# Patient Record
Sex: Male | Born: 1995 | Race: White | Hispanic: No | Marital: Single | Smoking: Never smoker
Health system: Southern US, Community
[De-identification: ages and names within clinical notes are randomized; demographics above are authoritative.]

## PROBLEM LIST (undated history)

## (undated) DIAGNOSIS — M109 Gout, unspecified: Secondary | ICD-10-CM

---
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
## 2013-08-24 ENCOUNTER — Emergency Department: Payer: Self-pay | Admitting: Emergency Medicine

## 2013-10-20 IMAGING — CT CT HEAD WITHOUT CONTRAST
2 series · 16 of 30 positions shown, 20 images · non-contrast
Comparison: none

REASON FOR EXAM: ams
COMMENTS:   May transport without cardiac monitor

PROCEDURE:     CT  - CT HEAD WITHOUT CONTRAST  - January 11, 2012  [DATE]
RESULT:     Comparison:  None
TECHNIQUE: Multiple axial images from the foramen magnum to the vertex were
obtained without IV contrast.

[Series 2: without · axial · non-contrast · 0.42mm/px · z∈[+680,+800]mm · 13 of 30 slices shown, 17 images]
[im 3/30  brain]
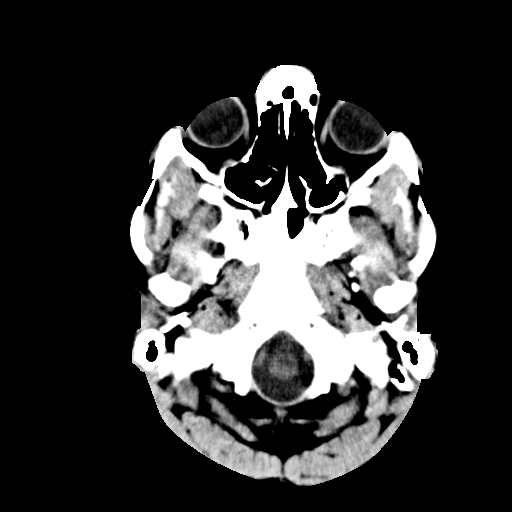
[im 3/30  bone]
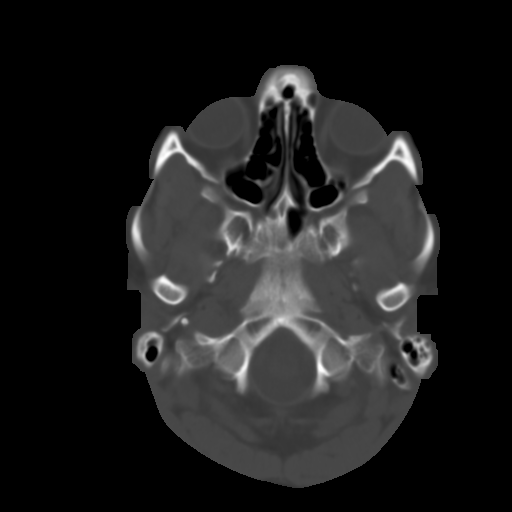
[im 5/30  brain]
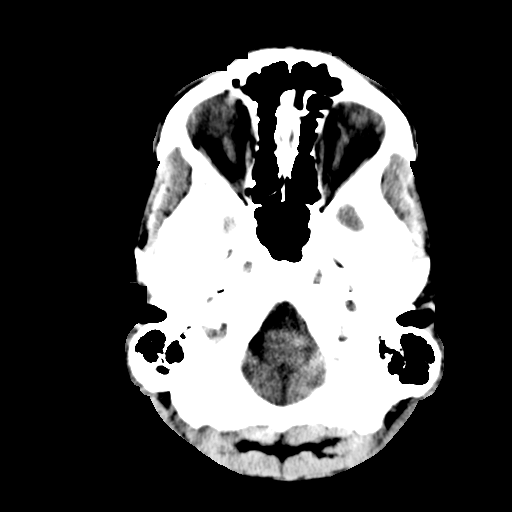
[im 7/30  brain]
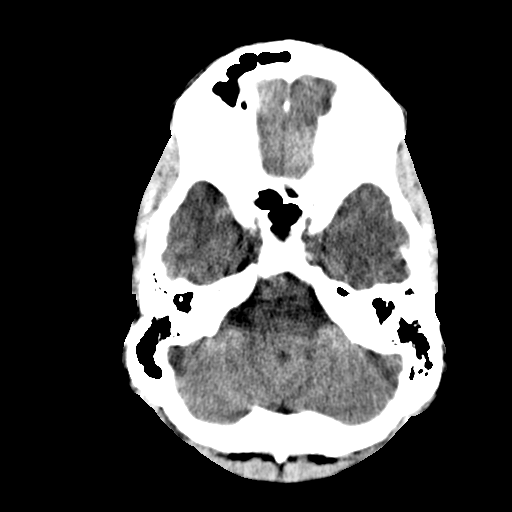
[im 9/30  brain]
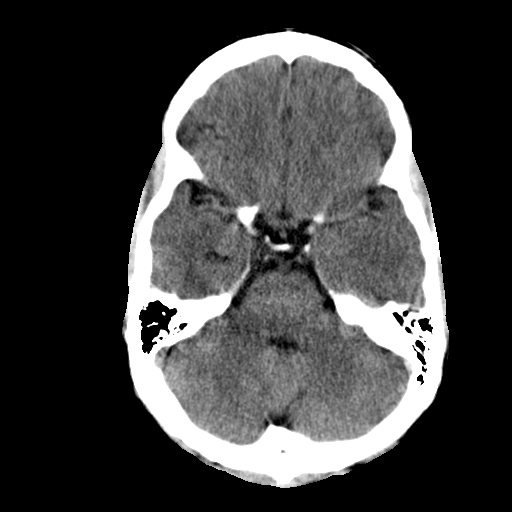
[im 11/30  brain]
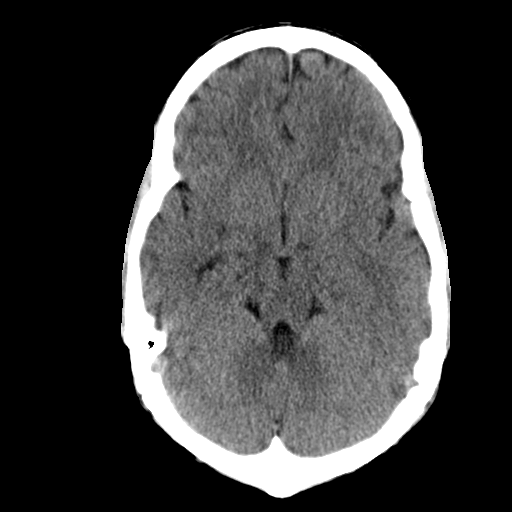
[im 11/30  bone]
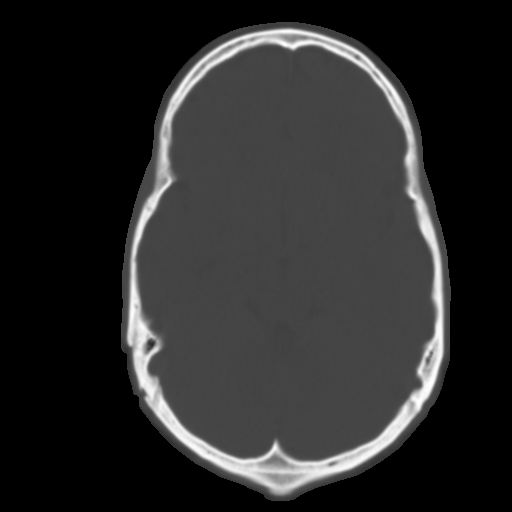
[im 13/30  brain]
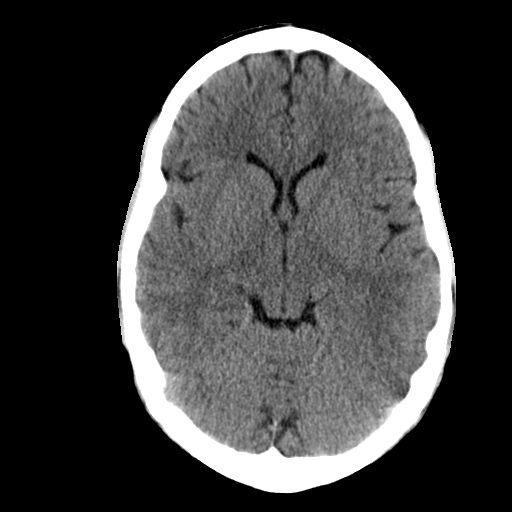
[im 15/30  brain]
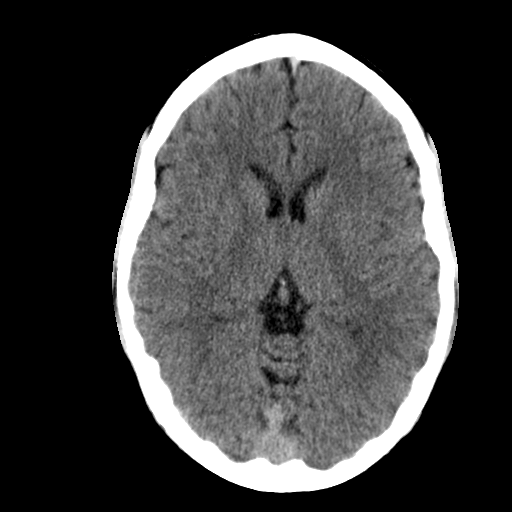
[im 17/30  brain]
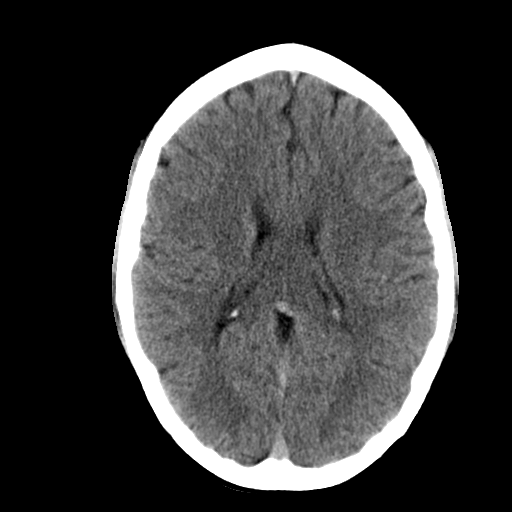
[im 19/30  brain]
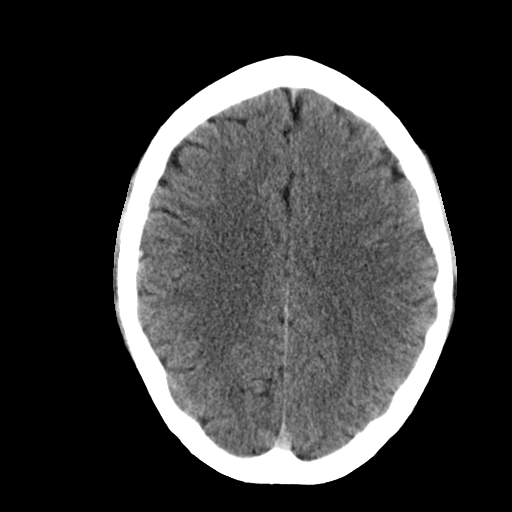
[im 19/30  bone]
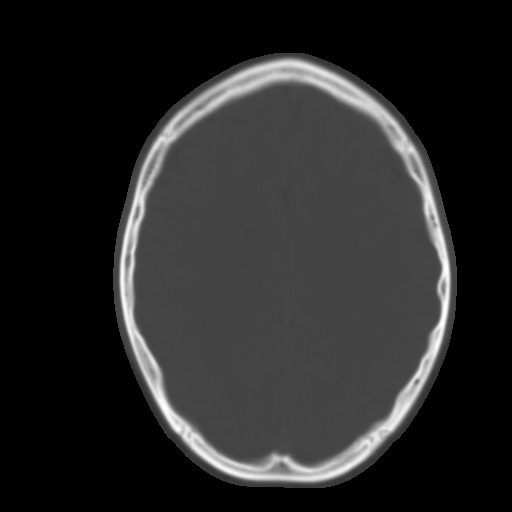
[im 21/30  brain]
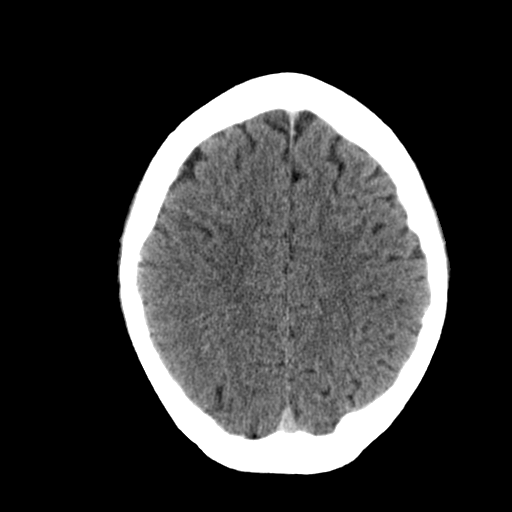
[im 23/30  brain]
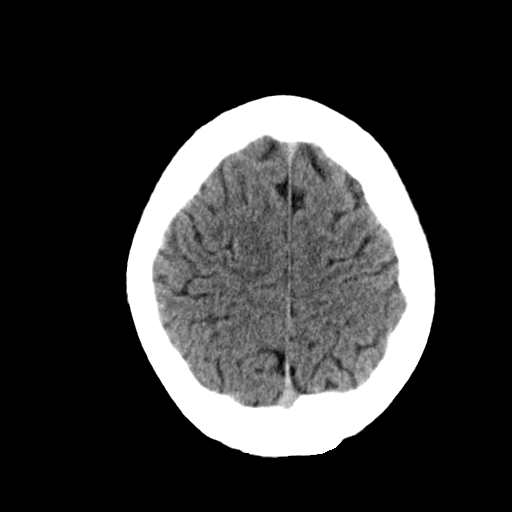
[im 25/30  brain]
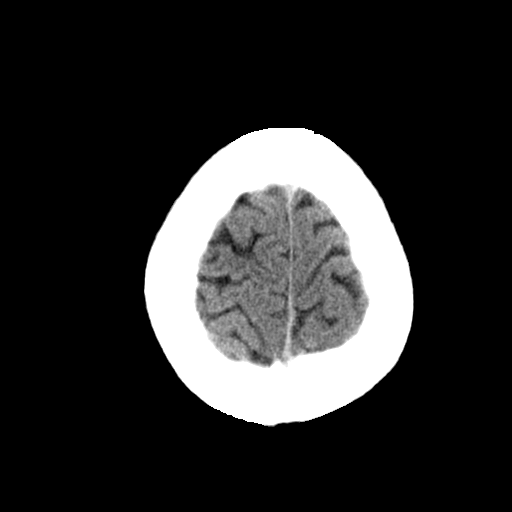
[im 27/30  brain]
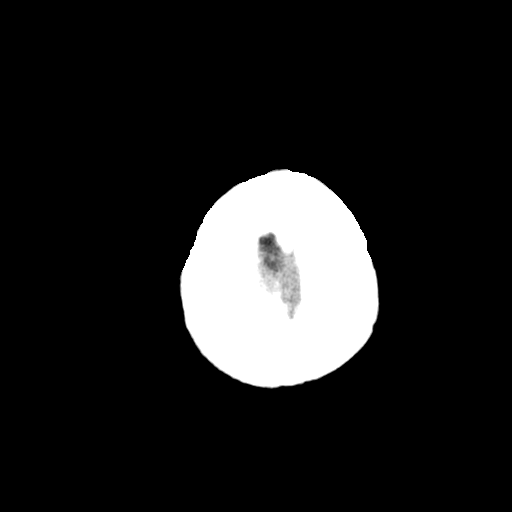
[im 27/30  bone]
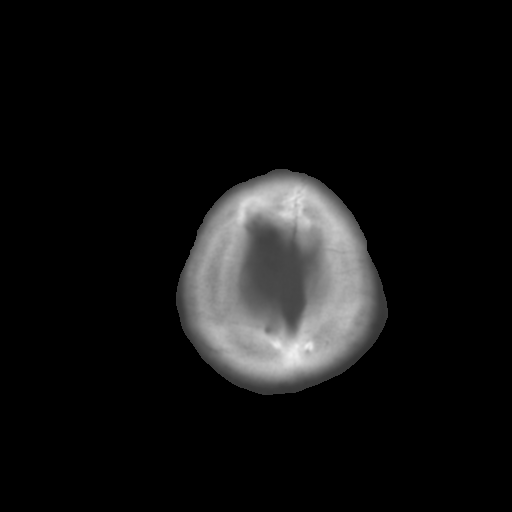

[Series 3: bone · axial · 0.42mm/px · z∈[+680,+720]mm · 3 of 30 slices shown]
[im 3/30  bone]
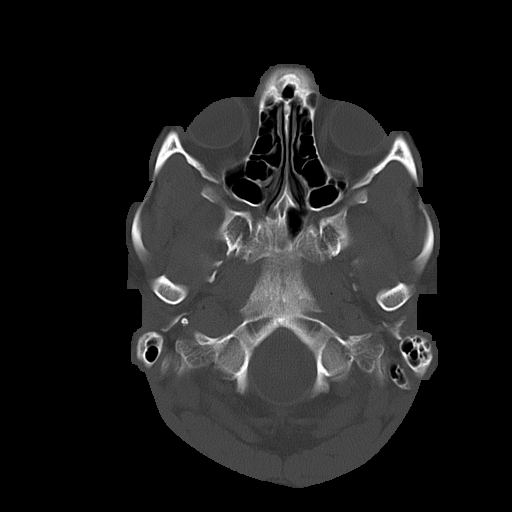
[im 7/30  bone]
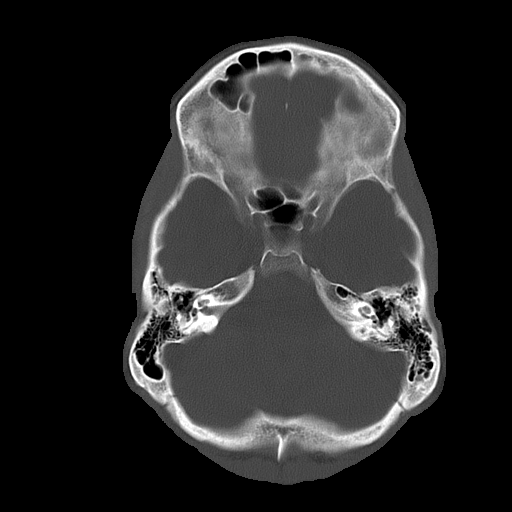
[im 11/30  bone]
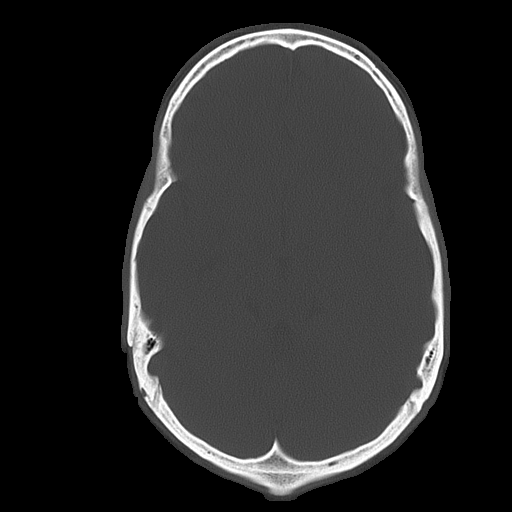

[16 of 30 positions shown; findings below may reference images not displayed]

FINDINGS: There is no evidence for mass effect, midline shift, or extra-axial fluid
collections. There is no evidence for space-occupying lesion, intracranial
hemorrhage, or cortical-based area of infarction.

The osseous structures are unremarkable.
IMPRESSION: No acute intracranial process.

## 2015-09-25 ENCOUNTER — Emergency Department
Admission: EM | Admit: 2015-09-25 | Discharge: 2015-09-25 | Disposition: A | Discharge status: Home / Self Care | Payer: Worker's Compensation | Attending: Student | Admitting: Student

## 2015-09-25 ENCOUNTER — Emergency Department: Payer: Worker's Compensation

## 2015-09-25 ENCOUNTER — Encounter: Payer: Self-pay | Admitting: Emergency Medicine

## 2015-09-25 DIAGNOSIS — W228XXA Striking against or struck by other objects, initial encounter: Secondary | ICD-10-CM | POA: Diagnosis not present

## 2015-09-25 DIAGNOSIS — Y990 Civilian activity done for income or pay: Secondary | ICD-10-CM | POA: Insufficient documentation

## 2015-09-25 DIAGNOSIS — Y9389 Activity, other specified: Secondary | ICD-10-CM | POA: Diagnosis not present

## 2015-09-25 DIAGNOSIS — Y9289 Other specified places as the place of occurrence of the external cause: Secondary | ICD-10-CM | POA: Insufficient documentation

## 2015-09-25 DIAGNOSIS — S01111A Laceration without foreign body of right eyelid and periocular area, initial encounter: Secondary | ICD-10-CM | POA: Insufficient documentation

## 2015-09-25 DIAGNOSIS — S0181XA Laceration without foreign body of other part of head, initial encounter: Secondary | ICD-10-CM

## 2015-09-25 DIAGNOSIS — S0511XA Contusion of eyeball and orbital tissues, right eye, initial encounter: Secondary | ICD-10-CM

## 2015-09-25 MED ORDER — TRAMADOL HCL 50 MG PO TABS: 50.0000 mg | ORAL_TABLET | Freq: Four times a day (QID) | ORAL | Status: AC | PRN

## 2015-09-25 MED ORDER — OXYCODONE-ACETAMINOPHEN 5-325 MG PO TABS
1.0000 | ORAL_TABLET | Freq: Once | ORAL | Status: AC
  Administered 2015-09-25: 1 via ORAL
  Filled 2015-09-25: qty 1

## 2015-09-25 MED ORDER — LIDOCAINE HCL (PF) 1 % IJ SOLN
INTRAMUSCULAR | Status: AC
  Administered 2015-09-25: 12:00:00
  Filled 2015-09-25: qty 5

## 2015-09-25 NOTE — ED Notes (Signed)
Just pta.  Was at work and a metal thing struck in his right eye--valve stem tool?

## 2015-09-25 NOTE — Discharge Instructions (Signed)
Advise to not apply any creams Altman 2 Dermabond area.

## 2015-09-25 NOTE — ED Provider Notes (Cosign Needed)
[facility] Emergency Department Provider Note  ____________________________________________  Time seen: Approximately 12:04 PM  I have reviewed the triage vital signs and the nursing notes.   HISTORY  Chief Complaint Laceration    HPI Kenneth Dunlap is a 20 y.o. male patient struck in the right eye with a stem valve tool secondary to changing a tire. Patient has a laceration of the upper eyelid. Incident occurred prior to arrival at work. He is rating his pain as 8/10. Patient state he has decreased ability to open the right eye secondary to pain. No paralysis measures taken prior to arrival.   History reviewed. No pertinent past medical history.  There are no active problems to display for this patient.   No past surgical history on file.  No current outpatient prescriptions on file.  Allergies Review of patient's allergies indicates no known allergies.  No family history on file.  Social History Social History  Substance Use Topics  . Smoking status: Never Smoker   . Smokeless tobacco: Current User  . Alcohol Use: Yes    Review of Systems Constitutional: No fever/chills Eyes: No visual changes. ENT: No sore throat. Cardiovascular: Denies chest pain. Respiratory: Denies shortness of breath. Gastrointestinal: No abdominal pain.  No nausea, no vomiting.  No diarrhea.  No constipation. Genitourinary: Negative for dysuria. Musculoskeletal: Negative for back pain. Skin: Negative for rash. Right upper eyelid laceration. Neurological: Negative for headaches, focal weakness or numbness. 10-point ROS otherwise negative.  ____________________________________________   PHYSICAL EXAM:  VITAL SIGNS: ED Triage Vitals  Enc Vitals Group     BP 09/25/15 1140 138/97 mmHg     Pulse Rate 09/25/15 1140 72     Resp 09/25/15 1140 16     Temp 09/25/15 1140 98.2 F (36.8 C)     Temp Source 09/25/15 1140 Oral     SpO2 09/25/15 1140 98 %     Weight  09/25/15 1140 190 lb (86.183 kg)     Height 09/25/15 1140  (1.753 m)     Head Cir --      Peak Flow --      Pain Score 09/25/15 1141 8     Pain Loc --      Pain Edu? --      Excl. in GC? --     Constitutional: Alert and oriented. Well appearing and in no acute distress. Eyes: Conjunctivae are normal. PERRL. EOMI. Head: Atraumatic. Nose: No congestion/rhinnorhea. Mouth/Throat: Mucous membranes are moist.  Oropharynx non-erythematous. Neck: No stridor.  No cervical spine tenderness to palpation. Hematological/Lymphatic/Immunilogical: No cervical lymphadenopathy. Cardiovascular: Normal rate, regular rhythm. Grossly normal heart sounds.  Good peripheral circulation. Respiratory: Normal respiratory effort.  No retractions. Lungs CTAB. Gastrointestinal: Soft and nontender. No distention. No abdominal bruits. No CVA tenderness. Musculoskeletal: No lower extremity tenderness nor edema.  No joint effusions. Neurologic:  Normal speech and language. No gross focal neurologic deficits are appreciated. No gait instability. Skin:  Skin is warm, dry and intact. No rash noted. There are 0.5 cm laceration upper eyelid. Psychiatric: Mood and affect are normal. Speech and behavior are normal.  ____________________________________________   LABS (all labs ordered are listed, but only abnormal results are displayed)  Labs Reviewed - No data to display ____________________________________________  EKG   ____________________________________________  RADIOLOGY  X-ray was negative for any facial fractures ____________________________________________   PROCEDURES  Procedure(s) performed: See procedure note  Critical [facility]____LACERATION REPAIR Performed by: Joni Reining Authorized by:  Joni Reining Consent: Verbal consent obtained. Risks and benefits: risks, benefits and alternatives were discussed Consent given by: patient Patient identity confirmed:  provided demographic data Prepped and Draped in normal sterile fashion Wound explored  Laceration Location: Right upper eyelid  Laceration Length: 0.5 cm  No Foreign Bodies seen or palpated  Anesthesia: local infiltration  Local anesthetic: lidocaine 1% without epinephrine  Anesthetic total: 1 mL  Irrigation method: syringe Amount of cleaning: standard  Skin closure: Dermabond  Patient tolerance: Patient tolerated the procedure well with no immediate complications. _______________________________   INITIAL IMPRESSION / ASSESSMENT AND PLAN / ED COURSE  Pertinent labs & imaging results that were available during my care of the patient were reviewed by me and considered in my medical decision making (see chart for details).  Facial laceration. Patient given discharge care instructions. Patient given prescription for tramadol. He is given a work note for today and advised return by ER for condition worsens. ____________________________________________   FINAL CLINICAL IMPRESSION(S) / ED DIAGNOSES  Final diagnoses:  Facial laceration, initial encounter  Periorbital contusion, right, initial encounter      Joni Reining, PA-C 09/25/15 1325

## 2016-06-11 ENCOUNTER — Encounter: Payer: Self-pay | Admitting: *Deleted

## 2016-06-11 ENCOUNTER — Emergency Department
Admission: EM | Admit: 2016-06-11 | Discharge: 2016-06-11 | Disposition: A | Discharge status: Home / Self Care | Payer: Self-pay | Attending: Student in an Organized Health Care Education/Training Program | Admitting: Student in an Organized Health Care Education/Training Program

## 2016-06-11 DIAGNOSIS — J039 Acute tonsillitis, unspecified: Secondary | ICD-10-CM | POA: Insufficient documentation

## 2016-06-11 DIAGNOSIS — Z79899 Other long term (current) drug therapy: Secondary | ICD-10-CM | POA: Insufficient documentation

## 2016-06-11 DIAGNOSIS — F17290 Nicotine dependence, other tobacco product, uncomplicated: Secondary | ICD-10-CM | POA: Insufficient documentation

## 2016-06-11 LAB — POCT RAPID STREP A: STREPTOCOCCUS, GROUP A SCREEN (DIRECT): NEGATIVE

## 2016-06-11 MED ORDER — AMOXICILLIN 500 MG PO CAPS: 500.0000 mg | ORAL_CAPSULE | Freq: Three times a day (TID) | ORAL | 0 refills | Status: AC

## 2016-06-11 MED ORDER — LIDOCAINE VISCOUS 2 % MT SOLN: 5.0000 mL | Freq: Four times a day (QID) | OROMUCOSAL | 0 refills | Status: AC | PRN

## 2016-06-11 MED ORDER — DIPHENHYDRAMINE HCL 12.5 MG/5ML PO SYRP: 12.5000 mg | ORAL_SOLUTION | Freq: Four times a day (QID) | ORAL | 0 refills | Status: AC | PRN

## 2016-06-11 NOTE — ED Triage Notes (Signed)
Pt complains of sore throat and fever for two days

## 2016-06-11 NOTE — ED Notes (Signed)
Sore throat, pain with swallowing X 2 days. Pt reports fever at home. Pt alert and oriented X4, active, cooperative, pt in NAD. RR even and unlabored, color WNL.

## 2016-06-11 NOTE — ED Provider Notes (Signed)
[facility] Emergency Department Provider Note   ____________________________________________   None    (approximate)  I have reviewed the triage vital signs and the nursing notes.   HISTORY  Chief Complaint Sore Throat    HPI Kenneth Dunlap is a 20 y.o. male patient complaining of sore throat for 2 days. Patient state increased difficulty tolerating food and fluids. Patient denies any dyspnea. Patient states taking Tylenol for the pain. Patient rates his pain discomfort as a 7/10. Patient described a pain as "sore".   History reviewed. No pertinent past medical history.  There are no active problems to display for this patient.   History reviewed. No pertinent surgical history.  Prior to Admission medications   Medication Sig Start Date End Date Taking? Authorizing Provider  traMADol (ULTRAM) 50 MG tablet Take 1 tablet (50 mg total) by mouth every 6 (six) hours as needed for moderate pain. 09/25/15   Joni Reining, PA-C    Allergies Review of patient's allergies indicates no known allergies.  No family history on file.  Social History Social History  Substance Use Topics  . Smoking status: Never Smoker  . Smokeless tobacco: Current User  . Alcohol use Yes    Review of Systems Constitutional: No fever/chills Eyes: No visual changes. ENT: Sore throat Cardiovascular: Denies chest pain. Respiratory: Denies shortness of breath. Gastrointestinal: No abdominal pain.  No nausea, no vomiting.  No diarrhea.  No constipation. Genitourinary: Negative for dysuria. Musculoskeletal: Negative for back pain. Skin: Negative for rash. Neurological: Negative for headaches, focal weakness or numbness.    ____________________________________________   PHYSICAL EXAM:  VITAL SIGNS: ED Triage Vitals  Enc Vitals Group     BP 06/11/16 0952 128/90     Pulse --      Resp 06/11/16 0952 20     Temp 06/11/16 0952 99.1 F (37.3 C)     Temp Source  06/11/16 0952 Oral     SpO2 06/11/16 0952 99 %     Weight 06/11/16 0947 190 lb (86.2 kg)     Height 06/11/16 0947 5\' 8"  (1.727 m)     Head Circumference --      Peak Flow --      Pain Score 06/11/16 0948 8     Pain Loc --      Pain Edu? --      Excl. in GC? --     Constitutional: Alert and oriented. Well appearing and in no acute distress. Eyes: Conjunctivae are normal. PERRL. EOMI. Head: Atraumatic. Nose: No congestion/rhinnorhea. Mouth/Throat: Mucous membranes are moist.  Oropharynx erythematous.Edematous with no exudative tonsils Neck: No stridor.  No cervical spine tenderness to palpation. Hematological/Lymphatic/Immunilogical: Bilateral cervical lymphadenopathy. Cardiovascular: Normal rate, regular rhythm. Grossly normal heart sounds.  Good peripheral circulation. Respiratory: Normal respiratory effort.  No retractions. Lungs CTAB. Gastrointestinal: Soft and nontender. No distention. No abdominal bruits. No CVA tenderness. Musculoskeletal: No lower extremity tenderness nor edema.  No joint effusions. Neurologic:  Normal speech and language. No gross focal neurologic deficits are appreciated. No gait instability. Skin:  Skin is warm, dry and intact. No rash noted. Psychiatric: Mood and affect are normal. Speech and behavior are normal.  ____________________________________________   LABS (all labs ordered are listed, but only abnormal results are displayed)  Labs Reviewed - No data to display ____________________________________________  EKG   ____________________________________________  RADIOLOGY   ____________________________________________   PROCEDURES  Procedure(s) performed: None  Procedures  Critical Care performed: No  ____________________________________________  INITIAL IMPRESSION / ASSESSMENT AND PLAN / ED COURSE  Pertinent labs & imaging results that were available during my care of the patient were reviewed by me and considered in my  medical decision making (see chart for details).  Tonsillitis. Patient given discharge care instructions. Patient given a prescription for amoxicillin, viscous lidocaine, and Benadryl elixir. Patient given a work note for 2 days.  Clinical Course     ____________________________________________   FINAL CLINICAL IMPRESSION(S) / ED DIAGNOSES  Final diagnoses:  Tonsillitis      NEW MEDICATIONS STARTED DURING THIS VISIT:  New Prescriptions   No medications on file     Note:  This document was prepared using Dragon voice recognition software and may include unintentional dictation errors.    Joni Reiningonald K Trayquan Kolakowski, PA-C 06/11/16 1111    Willy EddyPatrick Robinson, MD 06/11/16 [phone]

## 2016-06-11 NOTE — ED Notes (Signed)
Pt alert and oriented X4, active, cooperative, pt in NAD. RR even and unlabored, color WNL.  Pt informed to return if any life threatening symptoms occur.   

## 2016-06-13 LAB — CULTURE, GROUP A STREP (THRC)

## 2017-03-01 ENCOUNTER — Emergency Department: Payer: Self-pay

## 2017-03-01 ENCOUNTER — Emergency Department
Admission: EM | Admit: 2017-03-01 | Discharge: 2017-03-01 | Disposition: A | Discharge status: Home / Self Care | Payer: Self-pay | Attending: Emergency Medicine | Admitting: Emergency Medicine

## 2017-03-01 DIAGNOSIS — M109 Gout, unspecified: Secondary | ICD-10-CM | POA: Insufficient documentation

## 2017-03-01 DIAGNOSIS — F17290 Nicotine dependence, other tobacco product, uncomplicated: Secondary | ICD-10-CM | POA: Insufficient documentation

## 2017-03-01 MED ORDER — MELOXICAM 15 MG PO TABS: 15.0000 mg | ORAL_TABLET | Freq: Every day | ORAL | 0 refills | Status: AC

## 2017-03-01 MED ORDER — MELOXICAM 7.5 MG PO TABS
15.0000 mg | ORAL_TABLET | Freq: Once | ORAL | Status: AC
  Administered 2017-03-01: 15 mg via ORAL
  Filled 2017-03-01: qty 2

## 2017-03-01 MED ORDER — HYDROCODONE-ACETAMINOPHEN 5-325 MG PO TABS
1.0000 | ORAL_TABLET | Freq: Once | ORAL | Status: AC
  Administered 2017-03-01: 1 via ORAL
  Filled 2017-03-01: qty 1

## 2017-03-01 NOTE — ED Notes (Signed)
Pt states walks on concrete floor for work approx 10 hours daily. Pt states he began to have right foot pain with ambulation to bottom of great toe "a while ago". Pt states pain has progressively gotten worse to base of right foot. Cms intact to right toes. 3+ right pedal pulse noted.

## 2017-03-01 NOTE — ED Provider Notes (Signed)
[facility] Emergency Department Provider Note ____________________________________________  Time seen: Approximately 8:51 PM  I have reviewed the triage vital signs and the nursing notes.   HISTORY  Chief Complaint Foot Injury    HPI Kenneth Dunlap is a 21 y.o. male who presents to the emergency department for evaluation ofright foot pain. Pain is located on the plantar aspect of the right foot. He reports pain and swelling to the ball of foot for the past 2-3 days. No known injury. He walks on concrete for about 10 hours per day. No significant past medical history, specifically diabetes. He has not taken any medications to help alleviate this pain.  History reviewed. No pertinent past medical history.  There are no active problems to display for this patient.   History reviewed. No pertinent surgical history.  Prior to Admission medications   Medication Sig Start Date End Date Taking? Authorizing Provider  amoxicillin (AMOXIL) 500 MG capsule Take 1 capsule (500 mg total) by mouth 3 (three) times daily. 06/11/16   Joni ReiningSmith, Ronald K, PA-C  diphenhydrAMINE (BENYLIN) 12.5 MG/5ML syrup Take 5 mLs (12.5 mg total) by mouth 4 (four) times daily as needed for allergies. Mixed with 5 mL of viscous lidocaine swish and swallow. 06/11/16   Joni ReiningSmith, Ronald K, PA-C  lidocaine (XYLOCAINE) 2 % solution Use as directed 5 mLs in the mouth or throat every 6 (six) hours as needed for mouth pain. Eczema 5 mL of Benadryl elixir swish and swallow. 06/11/16   Joni ReiningSmith, Ronald K, PA-C  meloxicam (MOBIC) 15 MG tablet Take 1 tablet (15 mg total) by mouth daily. 03/01/17   Chadwick Reiswig B, FNP  traMADol (ULTRAM) 50 MG tablet Take 1 tablet (50 mg total) by mouth every 6 (six) hours as needed for moderate pain. 09/25/15   Joni ReiningSmith, Ronald K, PA-C    Allergies Patient has no known allergies.  No family history on file.  Social History Social History  Substance Use Topics  . Smoking status:  Never Smoker  . Smokeless tobacco: Current User  . Alcohol use Yes    Review of Systems Constitutional: Negative for recent injury or illness. Cardiovascular: Negative for change in skin color over the right foot Respiratory: Negative for cough or shortness of breath. Musculoskeletal: Positive for joint pain of the right great toe. Skin: Positive for increased skin temperature over the joint of the right great toe.  Neurological: Negative for paresthesias.  ____________________________________________   PHYSICAL EXAM:  VITAL SIGNS: ED Triage Vitals  Enc Vitals Group     BP 03/01/17 1932 138/64     Pulse Rate 03/01/17 1932 63     Resp 03/01/17 1932 16     Temp 03/01/17 1932 98.9 F (37.2 C)     Temp Source 03/01/17 1932 Oral     SpO2 03/01/17 1932 97 %     Weight 03/01/17 1926 195 lb (88.5 kg)     Height 03/01/17 1926 5\' 6"  (1.676 m)     Head Circumference --      Peak Flow --      Pain Score 03/01/17 1926 10     Pain Loc --      Pain Edu? --      Excl. in GC? --     Constitutional: Alert and oriented. Well appearing and in no acute distress. Eyes: Conjunctivae are clear without discharge or drainage.  Head: Atraumatic  Neck: Active, full range of motion noted. Respiratory: Respirations are even and unlabored Musculoskeletal:  Full, active range of motion throughout, specifically of the right foot and the right great toe. Pain with movement at the MCP of the right toe Neurologic: Sharp and dull sensation intact  Skin: Skin over the MCP joint of the right great toe is erythematous and warm to touch  Psychiatric: Behavior and affect appropriate.  ____________________________________________   LABS (all labs ordered are listed, but only abnormal results are displayed)  Labs Reviewed - No data to display ____________________________________________  RADIOLOGY  Right foot x-ray is negative for acute bony abnormality per  radiology. ____________________________________________   PROCEDURES  Procedure(s) performed: None  ____________________________________________   INITIAL IMPRESSION / ASSESSMENT AND PLAN / ED COURSE  JOSHUA SOULIER is a 21 y.o. male who presents to the emergency department with symptoms and exam most consistent with gout. He will be given a prescription for meloxicam and encouraged to follow up with the primary care provider of his choice. He was instructed to change his foot wear while he is at work as this may be contributing to his pain and symptoms. He was instructed to return to the emergency department for symptoms that change or worsen if he is unable schedule an appointment.  Pertinent labs & imaging results that were available during my care of the patient were reviewed by me and considered in my medical decision making (see chart for details).  _________________________________________   FINAL CLINICAL IMPRESSION(S) / ED DIAGNOSES  Final diagnoses:  Gouty arthritis of right great toe    Discharge Medication List as of 03/01/2017 10:03 PM    START taking these medications   Details  meloxicam (MOBIC) 15 MG tablet Take 1 tablet (15 mg total) by mouth daily., Starting Sun 03/01/2017, Print        If controlled substance prescribed during this visit, 12 month history viewed on the [facility] prior to issuing an initial prescription for Schedule II or III opiod.    Chinita Pester, FNP 03/02/17 1854    Minna Antis, MD 03/04/17 [phone]

## 2017-03-01 NOTE — ED Triage Notes (Signed)
Pt presents to ED via POV with c/o RIGHT foot pain. Pt reports pain and swelling to the ball of the right foot x2-3 days. Pt denies recent injury or trauma, reports "walking all day on concrete" and "pain has been getting worse over the last couple days and thought I should get it checked out". Pt ambulatory with steady gait, no difficulty or distress observed. Right foot is WPD, skin is not broken, CMS intact, cap refill <3 secs.

## 2017-06-10 ENCOUNTER — Emergency Department
Admission: EM | Admit: 2017-06-10 | Discharge: 2017-06-10 | Disposition: A | Discharge status: Home / Self Care | Payer: Self-pay | Attending: Emergency Medicine | Admitting: Emergency Medicine

## 2017-06-10 ENCOUNTER — Encounter: Payer: Self-pay | Admitting: Emergency Medicine

## 2017-06-10 DIAGNOSIS — Z791 Long term (current) use of non-steroidal anti-inflammatories (NSAID): Secondary | ICD-10-CM | POA: Insufficient documentation

## 2017-06-10 DIAGNOSIS — M109 Gout, unspecified: Secondary | ICD-10-CM | POA: Insufficient documentation

## 2017-06-10 DIAGNOSIS — F17220 Nicotine dependence, chewing tobacco, uncomplicated: Secondary | ICD-10-CM | POA: Insufficient documentation

## 2017-06-10 HISTORY — DX: Gout, unspecified: M10.9

## 2017-06-10 MED ORDER — NAPROXEN 500 MG PO TABS: 500.0000 mg | ORAL_TABLET | Freq: Two times a day (BID) | ORAL | Status: AC

## 2017-06-10 NOTE — ED Provider Notes (Signed)
[facility] Emergency Department Provider Note   ____________________________________________   First MD Initiated Contact with Patient 06/10/17 0802     (approximate)  I have reviewed the triage vital signs and the nursing notes.   HISTORY  Chief Complaint Foot Pain    HPI Kenneth Dunlap is a 21 y.o. male patient complaining pain and swelling to right great toe which occurred last night. Patient has history of gout.patient rates his pain discomfort as 8/10. Patient had a pain as "achy". No palliative measures for complaint.   Past Medical History:  Diagnosis Date  . Gout     There are no active problems to display for this patient.   History reviewed. No pertinent surgical history.  Prior to Admission medications   Medication Sig Start Date End Date Taking? Authorizing Provider  amoxicillin (AMOXIL) 500 MG capsule Take 1 capsule (500 mg total) by mouth 3 (three) times daily. 06/11/16   Joni Reining, PA-C  diphenhydrAMINE (BENYLIN) 12.5 MG/5ML syrup Take 5 mLs (12.5 mg total) by mouth 4 (four) times daily as needed for allergies. Mixed with 5 mL of viscous lidocaine swish and swallow. 06/11/16   Joni Reining, PA-C  lidocaine (XYLOCAINE) 2 % solution Use as directed 5 mLs in the mouth or throat every 6 (six) hours as needed for mouth pain. Eczema 5 mL of Benadryl elixir swish and swallow. 06/11/16   Joni Reining, PA-C  meloxicam (MOBIC) 15 MG tablet Take 1 tablet (15 mg total) by mouth daily. 03/01/17   Triplett, Rulon Eisenmenger B, FNP  naproxen (NAPROSYN) 500 MG tablet Take 1 tablet (500 mg total) by mouth 2 (two) times daily with a meal. 06/10/17   Joni Reining, PA-C  traMADol (ULTRAM) 50 MG tablet Take 1 tablet (50 mg total) by mouth every 6 (six) hours as needed for moderate pain. 09/25/15   Joni Reining, PA-C    Allergies Patient has no known allergies.  No family history on file.  Social History Social History  Substance Use Topics  .  Smoking status: Never Smoker  . Smokeless tobacco: Current User  . Alcohol use Yes    Review of Systems  Constitutional: No fever/chills Eyes: No visual changes. ENT: No sore throat. Cardiovascular: Denies chest pain. Respiratory: Denies shortness of breath. Gastrointestinal: No abdominal pain.  No nausea, no vomiting.  No diarrhea.  No constipation. Genitourinary: Negative for dysuria. Musculoskeletal: pain and swelling to right great toe. Skin: Negative for rash. Neurological: Negative for headaches, focal weakness or numbness.   ____________________________________________   PHYSICAL EXAM:  VITAL SIGNS: ED Triage Vitals  Enc Vitals Group     BP 06/10/17 0759 130/74     Pulse Rate 06/10/17 0759 (!) 53     Resp 06/10/17 0759 18     Temp 06/10/17 0759 98.3 F (36.8 C)     Temp Source 06/10/17 0759 Oral     SpO2 06/10/17 0759 98 %     Weight 06/10/17 0755 195 lb (88.5 kg)     Height --      Head Circumference --      Peak Flow --      Pain Score 06/10/17 0754 8     Pain Loc --      Pain Edu? --      Excl. in GC? --     Constitutional: Alert and oriented. Well appearing and in no acute distress. Neck: No stridor.  Hematological/Lymphatic/Immunilogical: No cervical lymphadenopathy. Cardiovascular: Normal  rate, regular rhythm. Grossly normal heart sounds.  Good peripheral circulation. Respiratory: Normal respiratory effort.  No retractions. Lungs CTAB. Gastrointestinal: Soft and nontender. No distention. No abdominal bruits. No CVA tenderness. Musculoskeletal: No lower extremity tenderness nor edema.  No joint effusions. Neurologic:  Normal speech and language. No gross focal neurologic deficits are appreciated. No gait instability. Skin:  Edema and erythema of right great toe.Marland Kitchen. Psychiatric: Mood and affect are normal. Speech and behavior are normal.  ____________________________________________   LABS (all labs ordered are listed, but only abnormal results are  displayed)  Labs Reviewed - No data to display ____________________________________________  EKG   ____________________________________________  RADIOLOGY  No results found.  ____________________________________________   PROCEDURES  Procedure(s) performed: None  Procedures  Critical Care performed: No  ____________________________________________   INITIAL IMPRESSION / ASSESSMENT AND PLAN / ED COURSE  As part of my medical decision making, I reviewed the following data within the electronic MEDICAL RECORD NUMBER    Pain edema right great toe secondary to gout. Patient given discharge Instructions. Patient [facility]. Take medication as directed.      ____________________________________________   FINAL CLINICAL IMPRESSION(S) / ED DIAGNOSES  Final diagnoses:  Gouty arthritis of right great toe      NEW MEDICATIONS STARTED DURING THIS VISIT:  New Prescriptions   NAPROXEN (NAPROSYN) 500 MG TABLET    Take 1 tablet (500 mg total) by mouth 2 (two) times daily with a meal.     Note:  This document was prepared using Dragon voice recognition software and may include unintentional dictation errors.    Joni ReiningSmith, Ronald K, PA-C 06/10/17 0830    Rockne MenghiniNorman, Anne-Caroline, MD 06/10/17 [phone]

## 2017-06-10 NOTE — ED Triage Notes (Signed)
Pain and swelling to right foot started last night. Pt with hx of gout.

## 2017-07-04 IMAGING — CR DG FACIAL BONES COMPLETE 3+V
1 series · 5 of 5 positions shown · non-contrast
Comparison: None.

CLINICAL DATA: Patient suffered a blow to the right eye today. Pain
and swelling. Initial encounter.

EXAM:
FACIAL BONES COMPLETE 3+V

[Series 1: dg facial bones complete · 0.14mm/px · 5 of 5 slices shown]
[im 1/5]
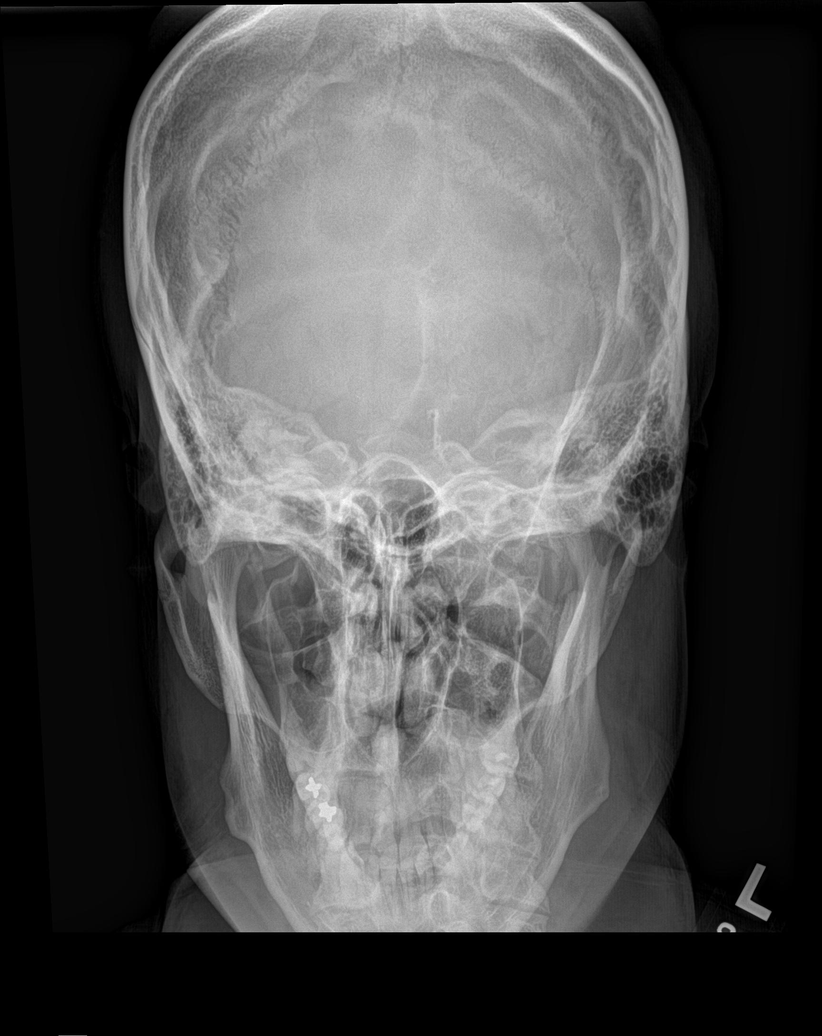
[im 2/5]
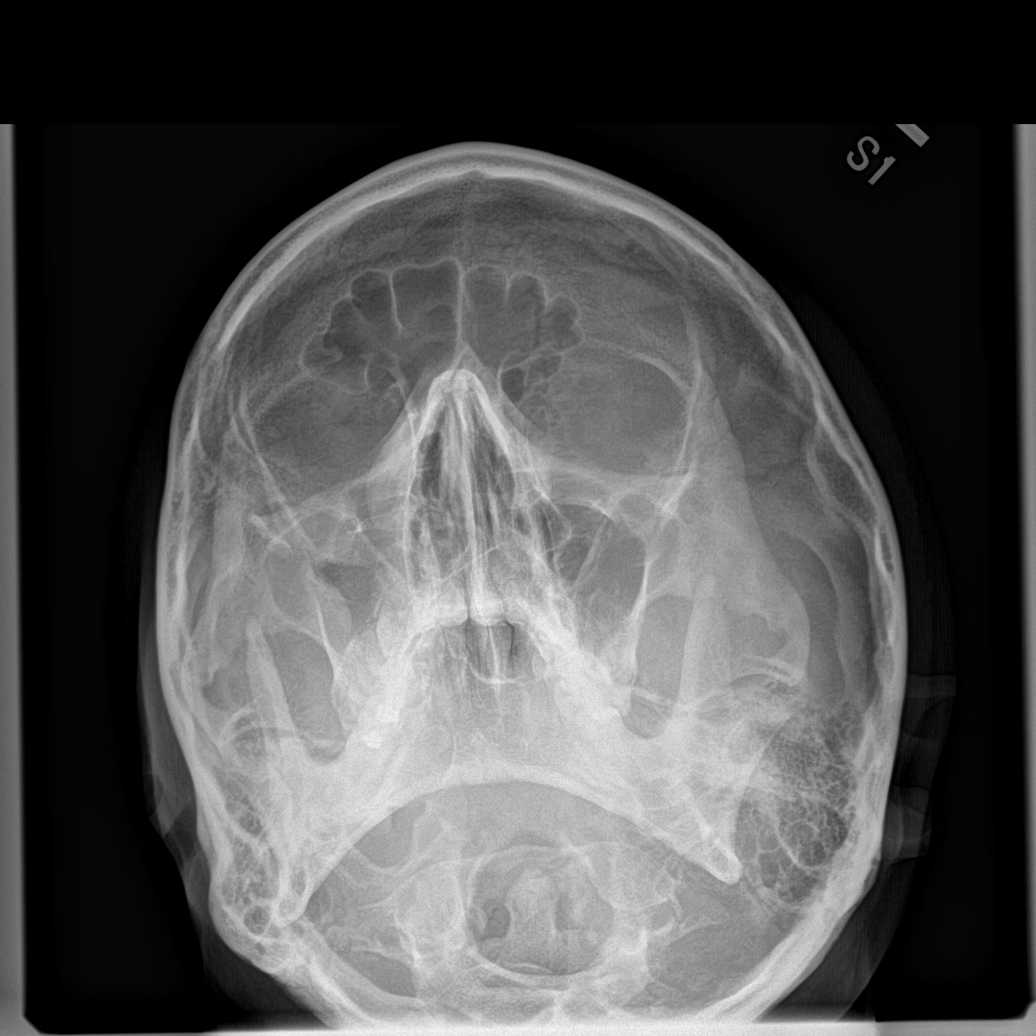
[im 3/5]
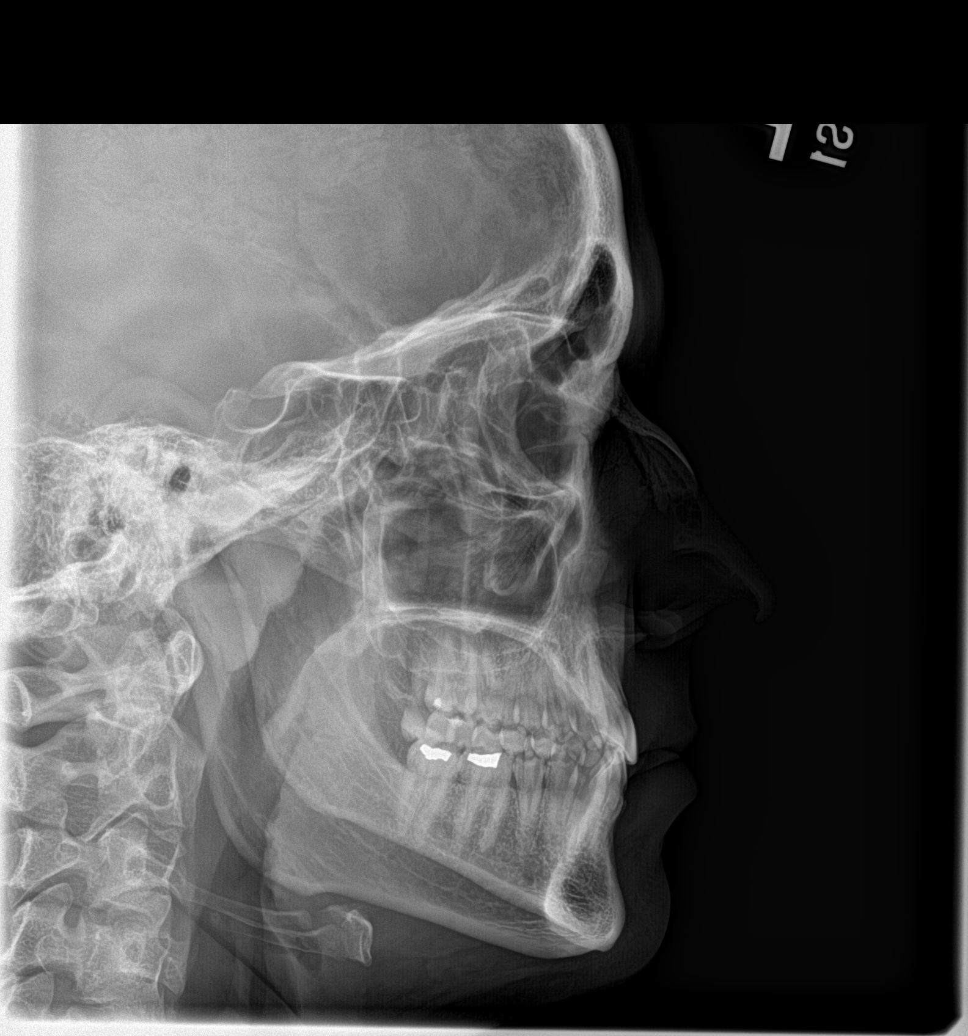
[im 4/5]
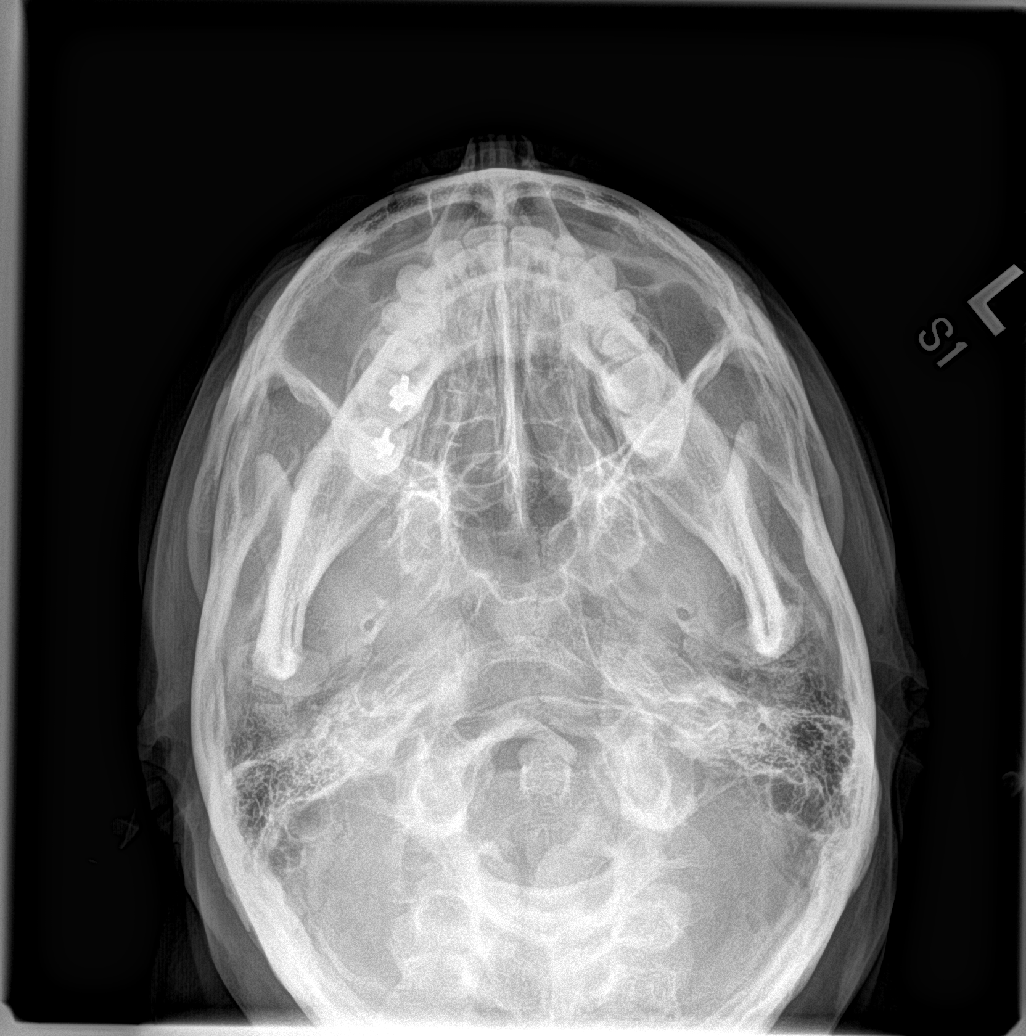
[im 5/5]
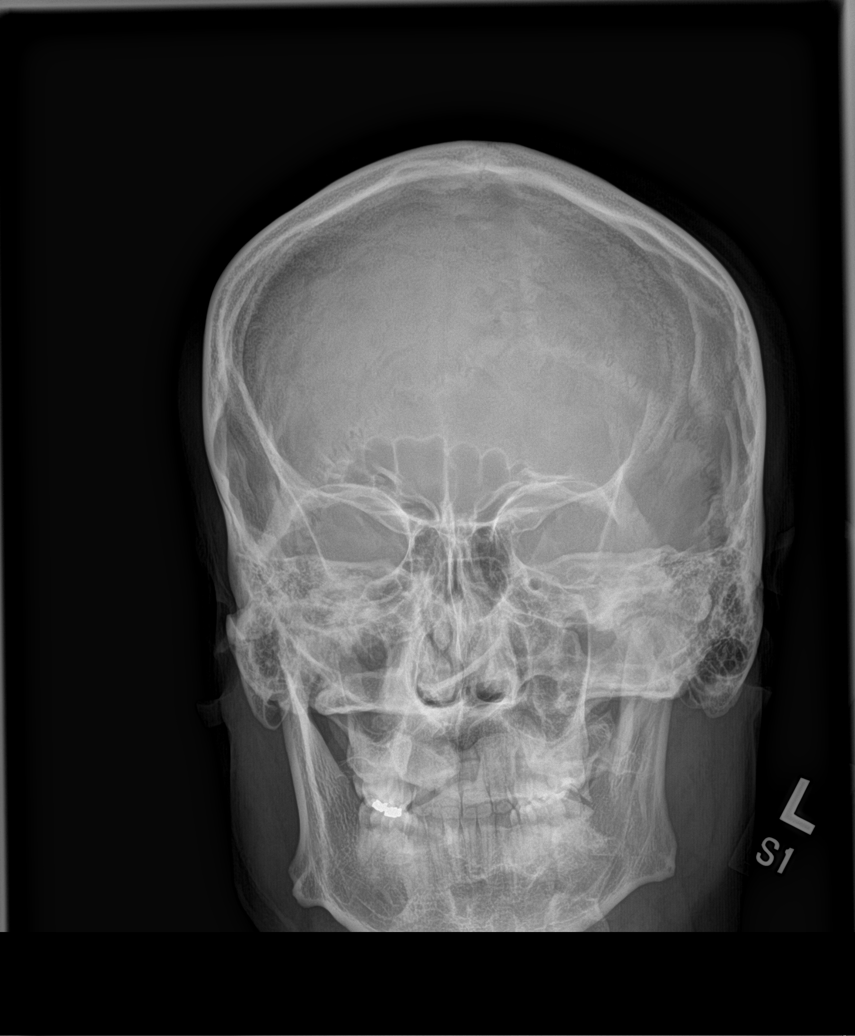

[5 of 5 positions shown; findings below may reference images not displayed]

FINDINGS: There is no evidence of fracture or other significant bone
abnormality. No orbital emphysema or sinus air-fluid levels are
seen.
IMPRESSION: Negative exam.

## 2018-05-25 DEATH — deceased
